# Patient Record
Sex: Female | Born: 1960 | Race: Black or African American | Hispanic: No | Marital: Single | Smoking: Never smoker
Health system: Southern US, Community
[De-identification: ages and names within clinical notes are randomized; demographics above are authoritative.]

---

## 1997-04-30 HISTORY — PX: TUBAL LIGATION: SHX77

## 2009-07-02 ENCOUNTER — Other Ambulatory Visit: Admission: RE | Admit: 2009-07-02 | Discharge: 2009-07-02 | Payer: Self-pay | Admitting: Obstetrics and Gynecology

## 2009-07-08 ENCOUNTER — Encounter: Admission: RE | Admit: 2009-07-08 | Discharge: 2009-07-08 | Payer: Self-pay | Admitting: Obstetrics and Gynecology

## 2010-08-16 ENCOUNTER — Other Ambulatory Visit: Payer: Self-pay | Admitting: Obstetrics and Gynecology

## 2010-08-16 DIAGNOSIS — Z1231 Encounter for screening mammogram for malignant neoplasm of breast: Secondary | ICD-10-CM

## 2010-08-19 ENCOUNTER — Emergency Department (HOSPITAL_COMMUNITY): Payer: PRIVATE HEALTH INSURANCE

## 2010-08-19 ENCOUNTER — Emergency Department (HOSPITAL_COMMUNITY)
Admission: EM | Admit: 2010-08-19 | Discharge: 2010-08-19 | Disposition: A | Discharge status: Home / Self Care | Payer: PRIVATE HEALTH INSURANCE | Attending: Emergency Medicine | Admitting: Emergency Medicine

## 2010-08-19 DIAGNOSIS — R059 Cough, unspecified: Secondary | ICD-10-CM | POA: Insufficient documentation

## 2010-08-19 DIAGNOSIS — R05 Cough: Secondary | ICD-10-CM | POA: Insufficient documentation

## 2010-08-19 DIAGNOSIS — I10 Essential (primary) hypertension: Secondary | ICD-10-CM | POA: Insufficient documentation

## 2010-08-19 DIAGNOSIS — E119 Type 2 diabetes mellitus without complications: Secondary | ICD-10-CM | POA: Insufficient documentation

## 2010-08-19 DIAGNOSIS — R0602 Shortness of breath: Secondary | ICD-10-CM | POA: Insufficient documentation

## 2010-08-19 LAB — BASIC METABOLIC PANEL
BUN: 13 mg/dL (ref 6–23)
CO2: 26 mEq/L (ref 19–32)
Chloride: 102 mEq/L (ref 96–112)
Creatinine, Ser: 1.1 mg/dL (ref 0.4–1.2)
GFR calc non Af Amer: 53 mL/min — ABNORMAL LOW (ref >60)
Glucose, Bld: 106 mg/dL — ABNORMAL HIGH (ref 70–99)
Potassium: 4.2 mEq/L (ref 3.5–5.1)
Sodium: 135 mEq/L (ref 135–145)

## 2010-08-19 LAB — DIFFERENTIAL
Basophils Absolute: 0 10*3/uL (ref 0.0–0.1)
Basophils Relative: 0 % (ref 0–1)
Neutro Abs: 5.9 10*3/uL (ref 1.7–7.7)
Neutrophils Relative %: 64 % (ref 43–77)

## 2010-08-19 LAB — CBC
HCT: 37.1 % (ref 36.0–46.0)
MCH: 29 pg (ref 26.0–34.0)
MCHC: 34.8 g/dL (ref 30.0–36.0)
RDW: 13.6 % (ref 11.5–15.5)

## 2010-08-19 LAB — GLUCOSE, CAPILLARY

## 2010-08-23 ENCOUNTER — Ambulatory Visit: Payer: Self-pay

## 2010-08-23 ENCOUNTER — Encounter: Payer: Self-pay | Admitting: Emergency Medicine

## 2010-08-23 ENCOUNTER — Ambulatory Visit (INDEPENDENT_AMBULATORY_CARE_PROVIDER_SITE_OTHER): Payer: PRIVATE HEALTH INSURANCE | Admitting: Emergency Medicine

## 2010-08-23 ENCOUNTER — Encounter: Payer: Self-pay | Admitting: *Deleted

## 2010-08-23 VITALS — BP 130/82 | HR 82 | Temp 98.7°F | Ht 63.5 in | Wt 188.4 lb

## 2010-08-23 DIAGNOSIS — R059 Cough, unspecified: Secondary | ICD-10-CM | POA: Insufficient documentation

## 2010-08-23 DIAGNOSIS — R05 Cough: Secondary | ICD-10-CM

## 2010-08-23 NOTE — Assessment & Plan Note (Signed)
-   stay off lisinopril - continue Nexium - restart loratadine - full PFT - voice rest - avoid throat clearing - avoid menthol cough drops - consider FOB if no resolution next time - rov 1 mo

## 2010-08-23 NOTE — Progress Notes (Signed)
Subjective:    Patient ID: Danielle Gardner, female    DOB: [birth year], 50 y.o.   MRN: [id number]  Cough This is a new problem. The current episode started more than 1 month ago. The problem has been gradually worsening. The problem occurs constantly. The cough is non-productive. Associated symptoms include chest pain, heartburn, nasal congestion, postnasal drip, rhinorrhea and a sore throat. Pertinent negatives include no chills, ear congestion, ear pain, fever, headaches, hemoptysis, myalgias, rash, shortness of breath, sweats, weight loss or wheezing. She has tried a beta-agonist inhaler for the symptoms. The treatment provided no relief. There is no history of asthma, bronchiectasis, bronchitis, COPD, emphysema, environmental allergies or pneumonia.      Review of Systems  Constitutional: Positive for fatigue. Negative for fever, chills and weight loss.  HENT: Positive for congestion, sore throat, rhinorrhea and postnasal drip. Negative for ear pain.   Respiratory: Positive for cough, choking and stridor. Negative for hemoptysis, chest tightness, shortness of breath and wheezing.   Cardiovascular: Positive for chest pain. Negative for leg swelling.  Gastrointestinal: Positive for heartburn.  Musculoskeletal: Negative for myalgias.  Skin: Negative for rash.  Neurological: Negative for headaches.  Hematological: Negative for environmental allergies.   Felt well until early March when she developed dry cough. Has a tickle in her chest and throat. Non-productive. Sometimes brings up food after eating. Used loratadine for a week, then stopped. Started on omeprazole in March, was just changed to Nexium last week. Also, of note was started on lisinopril March 20, stayed on it until April 20. She continues to have heartburn sx. More recently has had some PND, allergy sx, sore throat.   Past Medical History  Diagnosis Date  . Diabetes mellitus      Family History  Problem Relation Age of  Onset  . Prostate cancer Father   . Hypertension Father   . Hypertension Mother   . Hyperlipidemia Mother   . Diabetes Paternal Grandmother   . Diabetes Maternal Grandmother   . Colon cancer Brother   . Diabetes Brother      History   Social History  . Marital Status: Unknown    Spouse Name: N/A    Number of Children: N/A  . Years of Education: N/A   Occupational History  . administartive assistant at [employer]     Social History Main Topics  . Smoking status: Never Smoker   . Smokeless tobacco: Not on file  . Alcohol Use: No  . Drug Use: No  . Sexually Active: Not on file   Other Topics Concern  . Not on file   Social History Narrative  . No narrative on file     No Known Allergies   No outpatient prescriptions prior to visit.         Objective:   Physical Exam  Constitutional: She is oriented to person, place, and time. She appears well-developed and well-nourished. No distress.  HENT:  Head: Normocephalic and atraumatic.  Mouth/Throat: No oropharyngeal exudate.  Eyes: Conjunctivae are normal. Pupils are equal, round, and reactive to light. Right eye exhibits no discharge. Left eye exhibits no discharge. No scleral icterus.  Neck: Normal range of motion. Neck supple. No JVD present.  Cardiovascular: Normal rate and regular rhythm.  Exam reveals no gallop and no friction rub.   No murmur heard. Pulmonary/Chest: Effort normal and breath sounds normal. No respiratory distress. She has no wheezes. She has no rales. She exhibits no tenderness.  Musculoskeletal: Normal range of motion.  She exhibits no edema.  Lymphadenopathy:    She has no cervical adenopathy.  Neurological: She is alert and oriented to person, place, and time.  Skin: Skin is warm and dry. No rash noted. She is not diaphoretic.  Psychiatric: She has a normal mood and affect. Her behavior is normal. Judgment and thought content normal.          Assessment & Plan:  Cough - stay off  lisinopril - continue Nexium - restart loratadine - full PFT - voice rest - avoid throat clearing - avoid menthol cough drops - consider FOB if no resolution next time - rov 1 mo

## 2010-08-23 NOTE — Progress Notes (Signed)
  Subjective:    Patient ID: Danielle Gardner, female    DOB: 1960-09-28, 50 y.o.   MRN: [id number]  HPI    Review of Systems  HENT: Positive for congestion, sore throat, sneezing and postnasal drip.   Respiratory: Positive for cough.        Objective:   Physical Exam        Assessment & Plan:

## 2010-08-23 NOTE — Patient Instructions (Signed)
-   stay off lisinopril - continue Nexium daily - restart loratadine 10mg  daily - full Pulmonary Function Testing at next visit - voice rest this weekend if possible - avoid throat clearing - avoid menthol cough drops; use sugar-free candies instead - we will consider bronchoscopy if no resolution by next time - follow up with Dr Delton Coombes in 1 mo

## 2010-08-25 ENCOUNTER — Ambulatory Visit: Payer: Self-pay

## 2010-09-01 ENCOUNTER — Ambulatory Visit: Payer: Self-pay

## 2010-09-20 ENCOUNTER — Ambulatory Visit: Payer: PRIVATE HEALTH INSURANCE | Admitting: Emergency Medicine

## 2010-09-22 ENCOUNTER — Encounter: Payer: Self-pay | Admitting: Emergency Medicine

## 2010-09-27 ENCOUNTER — Other Ambulatory Visit: Payer: Self-pay | Admitting: Obstetrics and Gynecology

## 2010-09-27 DIAGNOSIS — Z1239 Encounter for other screening for malignant neoplasm of breast: Secondary | ICD-10-CM

## 2010-09-28 ENCOUNTER — Ambulatory Visit
Admission: RE | Admit: 2010-09-28 | Discharge: 2010-09-28 | Disposition: A | Discharge status: Home / Self Care | Payer: PRIVATE HEALTH INSURANCE | Source: Ambulatory Visit | Attending: Obstetrics and Gynecology | Admitting: Obstetrics and Gynecology

## 2010-09-28 DIAGNOSIS — Z1239 Encounter for other screening for malignant neoplasm of breast: Secondary | ICD-10-CM

## 2011-11-21 ENCOUNTER — Other Ambulatory Visit: Payer: Self-pay | Admitting: Family Medicine

## 2011-11-21 DIAGNOSIS — Z1231 Encounter for screening mammogram for malignant neoplasm of breast: Secondary | ICD-10-CM

## 2011-11-22 ENCOUNTER — Ambulatory Visit
Admission: RE | Admit: 2011-11-22 | Discharge: 2011-11-22 | Disposition: A | Discharge status: Home / Self Care | Payer: BC Managed Care – PPO | Source: Ambulatory Visit | Attending: Family Medicine | Admitting: Family Medicine

## 2011-11-22 DIAGNOSIS — Z1231 Encounter for screening mammogram for malignant neoplasm of breast: Secondary | ICD-10-CM

## 2012-04-25 ENCOUNTER — Other Ambulatory Visit (HOSPITAL_COMMUNITY)
Admission: RE | Admit: 2012-04-25 | Discharge: 2012-04-25 | Disposition: A | Discharge status: Home / Self Care | Payer: BC Managed Care – PPO | Source: Ambulatory Visit | Attending: Obstetrics and Gynecology | Admitting: Obstetrics and Gynecology

## 2012-04-25 ENCOUNTER — Other Ambulatory Visit: Payer: Self-pay | Admitting: Obstetrics and Gynecology

## 2012-04-25 DIAGNOSIS — Z01419 Encounter for gynecological examination (general) (routine) without abnormal findings: Secondary | ICD-10-CM | POA: Insufficient documentation

## 2012-04-25 DIAGNOSIS — N760 Acute vaginitis: Secondary | ICD-10-CM | POA: Insufficient documentation

## 2012-12-16 ENCOUNTER — Encounter (HOSPITAL_COMMUNITY): Payer: Self-pay | Admitting: Emergency Medicine

## 2012-12-16 ENCOUNTER — Emergency Department (HOSPITAL_COMMUNITY)
Admission: EM | Admit: 2012-12-16 | Discharge: 2012-12-16 | Disposition: A | Discharge status: Home / Self Care | Payer: PRIVATE HEALTH INSURANCE | Attending: Emergency Medicine | Admitting: Emergency Medicine

## 2012-12-16 ENCOUNTER — Emergency Department (HOSPITAL_COMMUNITY): Payer: PRIVATE HEALTH INSURANCE

## 2012-12-16 DIAGNOSIS — E119 Type 2 diabetes mellitus without complications: Secondary | ICD-10-CM | POA: Insufficient documentation

## 2012-12-16 DIAGNOSIS — R0789 Other chest pain: Secondary | ICD-10-CM | POA: Insufficient documentation

## 2012-12-16 DIAGNOSIS — M79609 Pain in unspecified limb: Secondary | ICD-10-CM | POA: Insufficient documentation

## 2012-12-16 DIAGNOSIS — R110 Nausea: Secondary | ICD-10-CM | POA: Insufficient documentation

## 2012-12-16 DIAGNOSIS — Z79899 Other long term (current) drug therapy: Secondary | ICD-10-CM | POA: Insufficient documentation

## 2012-12-16 DIAGNOSIS — M79602 Pain in left arm: Secondary | ICD-10-CM

## 2012-12-16 LAB — CBC WITH DIFFERENTIAL/PLATELET
Eosinophils Absolute: 0.1 10*3/uL (ref 0.0–0.7)
HCT: 35.7 % — ABNORMAL LOW (ref 36.0–46.0)
Lymphs Abs: 2.2 10*3/uL (ref 0.7–4.0)
MCV: 81.3 fL (ref 78.0–100.0)
Monocytes Absolute: 0.6 10*3/uL (ref 0.1–1.0)
Monocytes Relative: 12 % (ref 3–12)
Neutro Abs: 2.3 10*3/uL (ref 1.7–7.7)
Neutrophils Relative %: 45 % (ref 43–77)

## 2012-12-16 LAB — POCT I-STAT, CHEM 8: HCT: 37 % (ref 36.0–46.0)

## 2012-12-16 LAB — TROPONIN I: Troponin I: <0.3 ng/mL (ref <0.30)

## 2012-12-16 NOTE — ED Notes (Signed)
EKG given to EDP, Glick,MD.

## 2012-12-16 NOTE — ED Notes (Signed)
Pt arrived to the ED with a complaint of chest pain with radiation down her left arm.  Pt has been experiencing symptoms for three weeks.  Pain is located underneath the pt breast.  Tonight it woke her up from sleep and persisted for a period of time convincing her to come to the hospital.

## 2012-12-16 NOTE — ED Provider Notes (Signed)
Date: 12/16/2012  Rate: 56  Rhythm: sinus bradycardia  QRS Axis: normal  Intervals: PR prolonged  ST/T Wave abnormalities: normal  Conduction Disutrbances:first-degree A-V block   Narrative Interpretation: Sinus bradycardia, first degree AV block. No prior ECG available for comparison. Old EKG Reviewed: none available  Medical screening examination/treatment/procedure(s) were performed by non-physician practitioner and as supervising physician I was immediately available for consultation/collaboration.   Dione Booze, MD 12/16/12 [phone]

## 2012-12-16 NOTE — ED Provider Notes (Signed)
CSN: [id number]     Arrival date & time 12/16/12  0124 History     First MD Initiated Contact with Patient 12/16/12 [phone]     Chief Complaint  Patient presents with  . Arm Pain   (Consider location/radiation/quality/duration/timing/severity/associated sxs/prior Treatment) HPI Comments: This 52 year old female, who comes in reporting, that she the past 3, weeks.  She's had intermittent, left arm.  Pain.  That is sharp to dull in nature.  She she occasionally gets pain under her left breast associated with the arm.  Pain.  She's been told in the past, that she is "prediabetic" she does have a strong family history of diabetes.  Her last physical was last January reported, "normal" she has not contacted her primary care physician for this discomfort.  She has not taken any medication for this discomfort.  She cannot relate it to a particular activity, food intake  Patient is a 52 y.o. female presenting with arm pain. The history is provided by the patient.  Arm Pain This is a new problem. The current episode started 1 to 4 weeks ago. The problem occurs intermittently. The problem has been gradually worsening. Associated symptoms include chest pain and nausea. Pertinent negatives include no chills, congestion, coughing, fever, rash or sore throat. Nothing aggravates the symptoms. She has tried nothing for the symptoms.    Past Medical History  Diagnosis Date  . Diabetes mellitus    Past Surgical History  Procedure Laterality Date  . Tubal ligation  04-30-1997  . Cesarean section      x 3   Family History  Problem Relation Age of Onset  . Prostate cancer Father   . Hypertension Father   . Hypertension Mother   . Hyperlipidemia Mother   . Diabetes Paternal Grandmother   . Diabetes Maternal Grandmother   . Colon cancer Brother   . Diabetes Brother    History  Substance Use Topics  . Smoking status: Never Smoker   . Smokeless tobacco: Not on file  . Alcohol Use: No   OB History   Grav Para Term Preterm Abortions TAB SAB Ect Mult Living                 Review of Systems  Constitutional: Negative for fever and chills.  HENT: Negative for congestion and sore throat.   Respiratory: Negative for cough and shortness of breath.   Cardiovascular: Positive for chest pain. Negative for palpitations and leg swelling.  Gastrointestinal: Positive for nausea.  Musculoskeletal: Negative for back pain.  Skin: Negative for rash and wound.  Neurological: Negative for dizziness.  All other systems reviewed and are negative.    Allergies  Review of patient's allergies indicates no known allergies.  Home Medications   Current Outpatient Rx  Name  Route  Sig  Dispense  Refill  . omega-3 acid ethyl esters (LOVAZA) 1 G capsule   Oral   Take 1 g by mouth every evening.          BP 155/94  Pulse 55  Temp(Src) 98.4 F (36.9 C) (Oral)  Resp 16  SpO2 100%  LMP 09/15/2012 Physical Exam  Nursing note and vitals reviewed. Constitutional: She is oriented to person, place, and time. She appears well-developed and well-nourished.  HENT:  Head: Normocephalic.  Eyes: Pupils are equal, round, and reactive to light.  Neck: Normal range of motion.  Cardiovascular: Normal rate and regular rhythm.   Pulmonary/Chest: Effort normal and breath sounds normal. No respiratory distress. She has  no wheezes. She exhibits no tenderness.  Abdominal: Soft.  Musculoskeletal: Normal range of motion. She exhibits no edema and no tenderness.  Neurological: She is alert and oriented to person, place, and time.  Skin: Skin is warm. No rash noted. No erythema.    ED Course   Procedures (including critical care time)  Labs Reviewed  CBC WITH DIFFERENTIAL - Abnormal; Notable for the following:    HCT 35.7 (*)    Platelets 109 (*)    All other components within normal limits  POCT I-STAT, CHEM 8 - Abnormal; Notable for the following:    Creatinine, Ser 1.20 (*)    Glucose, Bld 133 (*)     All other components within normal limits  TROPONIN I   Dg Chest 2 View  12/16/2012   *RADIOLOGY REPORT*  Clinical Data: Chest pain radiating to the left arm on off for 3 weeks.  CHEST - 2 VIEW  Comparison: 08/19/2010  Findings: shallow inspiration. The heart size and pulmonary vascularity are normal. The lungs appear clear and expanded without focal air space disease or consolidation. No blunting of the costophrenic angles.  No pneumothorax.  Mediastinal contours appear intact.  Degenerative changes in the thoracic spine.  No significant change since previous study.  IMPRESSION: Shallow inspiration.  No evidence of active pulmonary disease.   Original Report Authenticated By: Burman Nieves, M.D.   1. Arm pain, diffuse, left   2. Chest pain, atypical    ED ECG REPORT   Date: 12/16/2012  EKG Time: 3:34 AM  Rate: 55  Rhythm: sinus bradycardia,  there are no previous tracings available for comparison  Axis: normal  Intervals:first-degree A-V block   ST&T Change: normal  Narrative Interpretation: abnorml           MDM  Discussed patient's lab results EKG, and chest x-ray with patient reassured.  Her and encourage her to followup with her primary care physician for further evaluation of her left arm pain    Arman Filter, NP 12/16/12 [phone]

## 2013-03-06 ENCOUNTER — Other Ambulatory Visit: Payer: Self-pay

## 2013-05-05 ENCOUNTER — Other Ambulatory Visit: Payer: Self-pay

## 2013-05-05 DIAGNOSIS — Z1231 Encounter for screening mammogram for malignant neoplasm of breast: Secondary | ICD-10-CM

## 2013-07-22 ENCOUNTER — Other Ambulatory Visit: Payer: Self-pay | Admitting: Family Medicine

## 2013-07-22 DIAGNOSIS — N189 Chronic kidney disease, unspecified: Secondary | ICD-10-CM

## 2013-07-23 ENCOUNTER — Ambulatory Visit
Admission: RE | Admit: 2013-07-23 | Discharge: 2013-07-23 | Disposition: A | Discharge status: Home / Self Care | Payer: BC Managed Care – PPO | Source: Ambulatory Visit | Attending: Family Medicine | Admitting: Family Medicine

## 2013-07-23 DIAGNOSIS — N189 Chronic kidney disease, unspecified: Secondary | ICD-10-CM

## 2013-07-24 ENCOUNTER — Other Ambulatory Visit: Payer: BC Managed Care – PPO

## 2013-10-01 ENCOUNTER — Ambulatory Visit
Admission: RE | Admit: 2013-10-01 | Discharge: 2013-10-01 | Disposition: A | Discharge status: Home / Self Care | Payer: BC Managed Care – PPO | Source: Ambulatory Visit

## 2013-10-01 ENCOUNTER — Other Ambulatory Visit: Payer: Self-pay | Admitting: Obstetrics and Gynecology

## 2013-10-01 ENCOUNTER — Other Ambulatory Visit (HOSPITAL_COMMUNITY)
Admission: RE | Admit: 2013-10-01 | Discharge: 2013-10-01 | Disposition: A | Discharge status: Home / Self Care | Payer: BC Managed Care – PPO | Source: Ambulatory Visit | Attending: Obstetrics and Gynecology | Admitting: Obstetrics and Gynecology

## 2013-10-01 DIAGNOSIS — Z1231 Encounter for screening mammogram for malignant neoplasm of breast: Secondary | ICD-10-CM

## 2013-10-01 DIAGNOSIS — Z1151 Encounter for screening for human papillomavirus (HPV): Secondary | ICD-10-CM | POA: Insufficient documentation

## 2013-10-01 DIAGNOSIS — Z01419 Encounter for gynecological examination (general) (routine) without abnormal findings: Secondary | ICD-10-CM | POA: Insufficient documentation

## 2013-10-08 LAB — CYTOLOGY - PAP

## 2015-04-05 ENCOUNTER — Other Ambulatory Visit: Payer: Self-pay

## 2015-04-05 DIAGNOSIS — Z0000 Encounter for general adult medical examination without abnormal findings: Secondary | ICD-10-CM

## 2015-05-04 ENCOUNTER — Other Ambulatory Visit: Payer: Self-pay | Admitting: Obstetrics and Gynecology

## 2015-05-04 ENCOUNTER — Other Ambulatory Visit (HOSPITAL_COMMUNITY)
Admission: RE | Admit: 2015-05-04 | Discharge: 2015-05-04 | Disposition: A | Discharge status: Home / Self Care | Payer: BLUE CROSS/BLUE SHIELD | Source: Ambulatory Visit | Attending: Obstetrics and Gynecology | Admitting: Obstetrics and Gynecology

## 2015-05-04 DIAGNOSIS — Z01419 Encounter for gynecological examination (general) (routine) without abnormal findings: Secondary | ICD-10-CM | POA: Diagnosis present

## 2015-05-07 ENCOUNTER — Ambulatory Visit
Admission: RE | Admit: 2015-05-07 | Discharge: 2015-05-07 | Disposition: A | Discharge status: Home / Self Care | Payer: BLUE CROSS/BLUE SHIELD | Source: Ambulatory Visit

## 2015-05-07 DIAGNOSIS — Z0000 Encounter for general adult medical examination without abnormal findings: Secondary | ICD-10-CM

## 2015-05-07 LAB — CYTOLOGY - PAP

## 2016-03-09 ENCOUNTER — Other Ambulatory Visit: Payer: Self-pay | Admitting: Family Medicine

## 2016-03-09 DIAGNOSIS — R634 Abnormal weight loss: Secondary | ICD-10-CM

## 2016-03-14 ENCOUNTER — Ambulatory Visit
Admission: RE | Admit: 2016-03-14 | Discharge: 2016-03-14 | Disposition: A | Discharge status: Home / Self Care | Payer: PRIVATE HEALTH INSURANCE | Source: Ambulatory Visit | Attending: Family Medicine | Admitting: Family Medicine

## 2016-03-14 DIAGNOSIS — R634 Abnormal weight loss: Secondary | ICD-10-CM

## 2016-07-07 ENCOUNTER — Other Ambulatory Visit: Payer: Self-pay | Admitting: Family Medicine

## 2016-07-07 DIAGNOSIS — R634 Abnormal weight loss: Secondary | ICD-10-CM

## 2016-07-11 ENCOUNTER — Other Ambulatory Visit: Payer: PRIVATE HEALTH INSURANCE

## 2016-07-24 ENCOUNTER — Other Ambulatory Visit: Payer: PRIVATE HEALTH INSURANCE

## 2016-07-24 ENCOUNTER — Ambulatory Visit
Admission: RE | Admit: 2016-07-24 | Discharge: 2016-07-24 | Disposition: A | Discharge status: Home / Self Care | Payer: PRIVATE HEALTH INSURANCE | Source: Ambulatory Visit | Attending: Family Medicine | Admitting: Family Medicine

## 2016-07-24 DIAGNOSIS — R634 Abnormal weight loss: Secondary | ICD-10-CM

## 2016-07-24 MED ORDER — IOPAMIDOL (ISOVUE-300) INJECTION 61%
100.0000 mL | Freq: Once | INTRAVENOUS | Status: AC | PRN
  Administered 2016-07-24: 100 mL via INTRAVENOUS

## 2016-10-12 ENCOUNTER — Ambulatory Visit: Payer: PRIVATE HEALTH INSURANCE | Admitting: Registered"

## 2016-10-25 ENCOUNTER — Encounter: Payer: Self-pay | Admitting: Registered"

## 2016-10-25 ENCOUNTER — Encounter: Payer: PRIVATE HEALTH INSURANCE | Attending: Family Medicine | Admitting: Registered"

## 2016-10-25 DIAGNOSIS — E119 Type 2 diabetes mellitus without complications: Secondary | ICD-10-CM | POA: Insufficient documentation

## 2016-10-25 DIAGNOSIS — Z713 Dietary counseling and surveillance: Secondary | ICD-10-CM | POA: Diagnosis not present

## 2016-10-25 DIAGNOSIS — E118 Type 2 diabetes mellitus with unspecified complications: Secondary | ICD-10-CM

## 2016-10-25 NOTE — Progress Notes (Signed)
Diabetes Self-Management Education  Visit Type: First/Initial  Appt. Start Time: 0900 Appt. End Time: 1000  10/25/2016  Danielle Gardner, identified by name and date of birth, is a 56 y.o. female with a diagnosis of Diabetes: Type 2.   ASSESSMENT Primary concerns today: Pt states her A1c was off the charts and she states her body was doing something "funky" and experienced unexplained ~20 lb weight loss Oct-Feb. Pt reports her weight started stabilizing end of March has stayed in the 160-165 lb. Pt thinks she she may have heart issues and will be following up with cardiologist.  Pt states she has a very busy life and is constantly on the go. Pt states she wakes up at 5:30, will exercise (1-3x/week), shower, breakfast.   Busy with children and son's activities, on the go. Pt states this diabetes education is a review for her because she dealt with her son's juvenile DM which he passed away in 6789 from F8BO complications.  Sleep quality varies and menopause symptoms can disturb sleep.      Diabetes Self-Management Education - 10/25/16 0901      Visit Information   Visit Type First/Initial     Initial Visit   Diabetes Type Type 2   Are you currently following a meal plan? No   Are you taking your medications as prescribed? No  avoids all medications, wants to use natural means to support health   Date Diagnosed 2011     Health Coping   How would you rate your overall health? Good     Psychosocial Assessment   Patient Belief/Attitude about Diabetes Motivated to manage diabetes   What is the last grade level you completed in school? some college     Complications   Last HgB A1C per patient/outside source 14 %  over 14 per referral documents   How often do you check your blood sugar? 1-2 times/day   Fasting Blood glucose range (mg/dL) 130-179  fasting and after exercising 135   Postprandial Blood glucose range (mg/dL) 130-179  170-180   Number of hypoglycemic episodes per  month 0   Number of hyperglycemic episodes per week 0   Have you had a dilated eye exam in the past 12 months? Yes   Have you had a dental exam in the past 12 months? No   Are you checking your feet? Yes   How many days per week are you checking your feet? 7     Dietary Intake   Breakfast spinach, fennel, 2 eggs   Snack (morning) none   Lunch oftne none "turned off from food" OR meal replacement shake or nuts-pistachios   Snack (afternoon) apple OR chips or crackers to take edge off   Dinner cereal OR salmon OR [place] sandwich OR 2 boiled eggs   Snack (evening) --  if eats after 6 she regurgitates     Exercise   Exercise Type Moderate (swimming / aerobic walking)   How many days per week to you exercise? 2   How many minutes per day do you exercise? 45   Total minutes per week of exercise 90     Patient Education   Previous Diabetes Education No   Disease state  Definition of diabetes, type 1 and 2, and the diagnosis of diabetes;Factors that contribute to the development of diabetes   Nutrition management  Role of diet in the treatment of diabetes and the relationship between the three main macronutrients and blood glucose level  Physical activity and exercise  Role of exercise on diabetes management, blood pressure control and cardiac health.   Monitoring Identified appropriate SMBG and/or A1C goals.;Purpose and frequency of SMBG.   Acute complications Taught treatment of hypoglycemia - the 15 rule.   Chronic complications Relationship between chronic complications and blood glucose control   Psychosocial adjustment Role of stress on diabetes     Individualized Goals (developed by patient)   Nutrition General guidelines for healthy choices and portions discussed   Physical Activity Exercise 3-5 times per week     Outcomes   Expected Outcomes Demonstrated interest in learning. Expect positive outcomes   Future DMSE PRN   Program Status Completed    Individualized Plan for  Diabetes Self-Management Training:   Learning Objective:  Patient will have a greater understanding of diabetes self-management. Patient education plan is to attend individual and/or group sessions per assessed needs and concerns.  Patient Instructions   Aim to get restful sleep, can try getting back into your tea routine before bed, and when waking during the night can try focusing on breath to slow the mind.  Continue with your plan for self-care and include creating ways to help manage stress.  Consider eating balanced meals and snacks and include proteins when eating carbs.  Consider check fasting BG before and after exercising.  Expected Outcomes:  Demonstrated interest in learning. Expect positive outcomes  Education material provided: Living Well with Diabetes and A1C conversion sheet, BG monitoring log book  If problems or questions, patient to contact team via:  Phone and MyChart  Future DSME appointment: PRN

## 2016-10-25 NOTE — Patient Instructions (Signed)
   Aim to get restful sleep, can try getting back into your tea routine before bed, and when waking during the night can try focusing on breath to slow the mind.  Continue with your plan for self-care and include creating ways to help manage stress.  Consider eating balanced meals and snacks and include proteins when eating carbs.  Consider check fasting BG before and after exercising.

## 2017-06-20 ENCOUNTER — Other Ambulatory Visit: Payer: Self-pay | Admitting: Family Medicine

## 2017-06-20 DIAGNOSIS — Z1231 Encounter for screening mammogram for malignant neoplasm of breast: Secondary | ICD-10-CM

## 2017-06-26 ENCOUNTER — Ambulatory Visit
Admission: RE | Admit: 2017-06-26 | Discharge: 2017-06-26 | Disposition: A | Discharge status: Home / Self Care | Payer: PRIVATE HEALTH INSURANCE | Source: Ambulatory Visit | Attending: Family Medicine | Admitting: Family Medicine

## 2017-06-26 DIAGNOSIS — Z1231 Encounter for screening mammogram for malignant neoplasm of breast: Secondary | ICD-10-CM

## 2017-07-25 ENCOUNTER — Other Ambulatory Visit (HOSPITAL_COMMUNITY)
Admission: RE | Admit: 2017-07-25 | Discharge: 2017-07-25 | Disposition: A | Discharge status: Home / Self Care | Payer: PRIVATE HEALTH INSURANCE | Source: Ambulatory Visit | Attending: Obstetrics and Gynecology | Admitting: Obstetrics and Gynecology

## 2017-07-25 ENCOUNTER — Other Ambulatory Visit: Payer: Self-pay | Admitting: Obstetrics and Gynecology

## 2017-07-25 DIAGNOSIS — Z124 Encounter for screening for malignant neoplasm of cervix: Secondary | ICD-10-CM | POA: Diagnosis not present

## 2017-07-26 LAB — CYTOLOGY - PAP
Adequacy: ABSENT
Diagnosis: NEGATIVE
HPV (WINDOPATH): NOT DETECTED

## 2017-10-25 DIAGNOSIS — Q263 Partial anomalous pulmonary venous connection: Secondary | ICD-10-CM | POA: Insufficient documentation

## 2017-10-25 NOTE — Progress Notes (Signed)
Cardiology Office Note    Date:  10/26/2017   ID:  Danielle Gardner, DOB 1960-06-23, MRN [id number]  PCP:  Mayra Neer, MD  Cardiologist: Sinclair Grooms, MD   Chief Complaint  Patient presents with  . Coronary Artery Disease  . Follow-up    Abnormal CT scan    History of Present Illness:  Danielle Gardner is a 57 y.o. female referred for abnormal CT chest "No definite coronary artery calcifications. 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 Left upper lobe pulmonary veins are draining into the left brachiocephalic vein."  Dyspnea on exertion and occasional PND.  States this is been going on for years.  The patient requested cardiology consult.  Several months ago she was having unexplained weight loss.  This led to CT scan to look for explanations.  CT demonstrated partial anomalous pulmonary venous return from the left upper lobe to the left brachiocephalic vein.  She hopes to be proactive not to have heart problems and wanted further explanation.  She denies chest discomfort, exertional fatigue, dyspnea, edema, and syncope.  She has occasional palpitations.  These episodes do not last longer than seconds.  In reviewing other clinical data she has a terrible metabolic state with uncontrolled diabetes, severe hyperlipidemia, possible calcification in coronary arteries, and uncontrolled blood pressure.  She is apparently resistant therapy.  Past Medical History:  Diagnosis Date  . Diabetes mellitus     Past Surgical History:  Procedure Laterality Date  . CESAREAN SECTION     x 3  . TUBAL LIGATION  04-30-1997    Current Medications: Outpatient Medications Prior to Visit  Medication Sig Dispense Refill  . aspirin 81 MG tablet Take 81 mg by mouth daily.    . Cholecalciferol (VITAMIN D) 2000 units tablet Take 1 tablet by mouth daily.    . insulin degludec (TRESIBA) 100 UNIT/ML SOPN FlexTouch Pen Inject 25 Units  into the skin daily as needed. (Pending blood glucose)    . omega-3 acid ethyl esters (LOVAZA) 1 G capsule Take 1 g by mouth every evening.     No facility-administered medications prior to visit.      Allergies:   Patient has no known allergies.   Social History   Socioeconomic History  . Marital status: Single    Spouse name: Not on file  . Number of children: Not on file  . Years of education: Not on file  . Highest education level: Not on file  Occupational History  . Occupation: Advertising account executive at [employer]  . Financial resource strain: Not on file  . Food insecurity:    Worry: Not on file    Inability: Not on file  . Transportation needs:    Medical: Not on file    Non-medical: Not on file  Tobacco Use  . Smoking status: Never Smoker  . Smokeless tobacco: Never Used  Substance and Sexual Activity  . Alcohol use: No  . Drug use: No  . Sexual activity: Not on file  Lifestyle  . Physical activity:    Days per week: Not on file    Minutes per session: Not on file  . Stress: Not on file  Relationships  . Social connections:    Talks on phone: Not on file    Gets together: Not on file    Attends religious service: Not on file    Active member of club or organization: Not  on file    Attends meetings of clubs or organizations: Not on file    Relationship status: Not on file  Other Topics Concern  . Not on file  Social History Narrative  . Not on file     Family History:  The patient's family history includes Colon cancer in her brother; Diabetes in her brother, brother, father, maternal grandmother, paternal grandmother, and son; Heart disease in her father; Hyperlipidemia in her mother; Hypertension in her father and mother; Lupus in her sister; Prostate cancer in her father; Vision loss in her father.   ROS:   Please see the history of present illness.    Splane weight change. All other systems reviewed and are negative.   PHYSICAL EXAM:    VS:  BP (!) 160/96   Pulse 60   Ht 5\' 4"  (1.626 m)   Wt 169 lb (76.7 kg)   LMP 09/15/2012   BMI 29.01 kg/m    GEN: Well nourished, well developed, in no acute distress  HEENT: normal  Neck: no JVD, carotid bruits, or masses Cardiac: RRR; no murmurs, rubs, or gallops,no edema  Respiratory:  clear to auscultation bilaterally, normal work of breathing GI: soft, nontender, nondistended, + BS MS: no deformity or atrophy  Skin: warm and dry, no rash Neuro:  Alert and Oriented x 3, Strength and sensation are intact Psych: euthymic mood, full affect  Wt Readings from Last 3 Encounters:  10/26/17 169 lb (76.7 kg)  08/23/10 188 lb 6.4 oz (85.5 kg)      Studies/Labs Reviewed:   EKG:  EKG low-voltage with otherwise normal appearance  Recent Labs: No results found for requested labs within last 8760 hours.   Lipid Panel No results found for: CHOL, TRIG, HDL, CHOLHDL, VLDL, LDLCALC, LDLDIRECT  Additional studies/ records that were reviewed today include:  No prior cardiac data is available.    ASSESSMENT:    1. Partial anomalous pulmonary venous return   2. Controlled type 2 diabetes mellitus with other circulatory complication, without long-term current use of insulin ([place])   3. Essential hypertension   4. Familial hypercholesterolemia   5. Family history of early CAD      PLAN:  In order of problems listed above:  1. Can be associated with ASD.  We will do a 2D Doppler echocardiogram with bubble study.  Any evidence of right heart volume overload or shunting may require further evaluation.  If no ASD and normal right ventricular function, partial anomalous pulmonary venous return will require no subsequent management. 2. Needs hemoglobin A1c less than 7.  I spoke to her concerning the utility of Tyler Aas and the importance of taking the medication daily and working vigorously to get A1c under control. 3. Needs therapy for hypertension.  Target 130/80 mmHg.  Backbone of  therapy should be with an ACE or arm to protect kidney function.  Long discussion with her concerning this. 4. Needs to be on statin therapy to push LDL less than or equal to 70.  This will go a long way in preventing future cardiac events.  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  2D Doppler echocardiogram will be done to exclude right heart volume overload and ASD.  More importantly, her metabolic status is terrible and she is high risk for vascular events going forward.  I discussed the importance of aerobic activity including greater than 150 minutes/week of moderate physical activity, change in diet to include a more plant-based approach, weight  loss, and medication therapy to control risk factors as noted above.  She seems reluctant to accept the idea that risk modification with medication will be helpful.    Medication Adjustments/Labs and Tests Ordered: Current medicines are reviewed at length with the patient today.  Concerns regarding medicines are outlined above.  Medication changes, Labs and Tests ordered today are listed in the Patient Instructions below. Patient Instructions  Medication Instructions:  Your physician recommends that you continue on your current medications as directed. Please refer to the Current Medication list given to you today.  Labwork: None  Testing/Procedures: Your physician has requested that you have an echocardiogram bubble study. Echocardiography is a painless test that uses sound waves to create images of your heart. It provides your doctor with information about the size and shape of your heart and how well your heart's chambers and valves are working. This procedure takes approximately one hour. There are no restrictions for this procedure.    Follow-Up: Your physician recommends that you schedule a follow-up appointment as needed with Dr. Tamala Julian.    Any Other Special Instructions Will Be Listed Below (If  Applicable).     If you need a refill on your cardiac medications before your next appointment, please call your pharmacy.      Signed, Sinclair Grooms, MD  10/26/2017 12:51 PM    [facility] [address], [place], [place]  [address] Phone: [phone]; Fax: [phone]

## 2017-10-26 ENCOUNTER — Encounter: Payer: Self-pay | Admitting: Interventional Cardiology

## 2017-10-26 ENCOUNTER — Ambulatory Visit (INDEPENDENT_AMBULATORY_CARE_PROVIDER_SITE_OTHER): Payer: PRIVATE HEALTH INSURANCE | Admitting: Interventional Cardiology

## 2017-10-26 VITALS — BP 160/96 | HR 60 | Ht 64.0 in | Wt 169.0 lb

## 2017-10-26 DIAGNOSIS — I10 Essential (primary) hypertension: Secondary | ICD-10-CM

## 2017-10-26 DIAGNOSIS — E7801 Familial hypercholesterolemia: Secondary | ICD-10-CM

## 2017-10-26 DIAGNOSIS — E1159 Type 2 diabetes mellitus with other circulatory complications: Secondary | ICD-10-CM

## 2017-10-26 DIAGNOSIS — Q263 Partial anomalous pulmonary venous connection: Secondary | ICD-10-CM | POA: Diagnosis not present

## 2017-10-26 DIAGNOSIS — Z8249 Family history of ischemic heart disease and other diseases of the circulatory system: Secondary | ICD-10-CM

## 2017-10-26 NOTE — Patient Instructions (Signed)
Medication Instructions:  Your physician recommends that you continue on your current medications as directed. Please refer to the Current Medication list given to you today.  Labwork: None  Testing/Procedures: Your physician has requested that you have an echocardiogram bubble study. Echocardiography is a painless test that uses sound waves to create images of your heart. It provides your doctor with information about the size and shape of your heart and how well your heart's chambers and valves are working. This procedure takes approximately one hour. There are no restrictions for this procedure.    Follow-Up: Your physician recommends that you schedule a follow-up appointment as needed with Dr. Tamala Julian.    Any Other Special Instructions Will Be Listed Below (If Applicable).     If you need a refill on your cardiac medications before your next appointment, please call your pharmacy.

## 2017-11-05 ENCOUNTER — Other Ambulatory Visit: Payer: Self-pay

## 2017-11-05 ENCOUNTER — Ambulatory Visit (HOSPITAL_COMMUNITY): Payer: PRIVATE HEALTH INSURANCE | Attending: Cardiology

## 2017-11-05 DIAGNOSIS — E119 Type 2 diabetes mellitus without complications: Secondary | ICD-10-CM | POA: Insufficient documentation

## 2017-11-05 DIAGNOSIS — Q263 Partial anomalous pulmonary venous connection: Secondary | ICD-10-CM | POA: Insufficient documentation

## 2017-11-05 DIAGNOSIS — I358 Other nonrheumatic aortic valve disorders: Secondary | ICD-10-CM | POA: Diagnosis not present

## 2017-11-05 DIAGNOSIS — I10 Essential (primary) hypertension: Secondary | ICD-10-CM | POA: Diagnosis not present

## 2017-11-05 DIAGNOSIS — E785 Hyperlipidemia, unspecified: Secondary | ICD-10-CM | POA: Diagnosis not present

## 2017-11-08 ENCOUNTER — Telehealth: Payer: Self-pay | Admitting: Interventional Cardiology

## 2017-11-08 NOTE — Telephone Encounter (Signed)
New Message   Pt returning call for [facility]. Regarding results

## 2017-11-08 NOTE — Telephone Encounter (Signed)
Informed pt of results. Pt verbalized understanding. 

## 2017-11-16 ENCOUNTER — Telehealth: Payer: Self-pay | Admitting: Interventional Cardiology

## 2017-11-16 DIAGNOSIS — C9200 Acute myeloblastic leukemia, not having achieved remission: Secondary | ICD-10-CM

## 2017-11-16 NOTE — Telephone Encounter (Signed)
Pt calling and stating she is returning a call to nurse. Please call pt.

## 2017-11-16 NOTE — Telephone Encounter (Signed)
Pt is insisting on having CTA on what was stated in echo impressions section.Will forward to Dr Tamala Julian for review .Adonis Housekeeper

## 2017-11-19 NOTE — Telephone Encounter (Signed)
PT AWARE AND ORDER ENTERED .Danielle Gardner

## 2017-11-19 NOTE — Telephone Encounter (Signed)
Okay to order cardiac CT Angiogram " partial anomalous pulmonary venous return".  Will not likely change our approach.

## 2018-03-08 ENCOUNTER — Ambulatory Visit (HOSPITAL_COMMUNITY)
Admission: RE | Admit: 2018-03-08 | Discharge: 2018-03-08 | Disposition: A | Discharge status: Home / Self Care | Payer: PRIVATE HEALTH INSURANCE | Source: Ambulatory Visit | Attending: Interventional Cardiology | Admitting: Interventional Cardiology

## 2018-03-08 DIAGNOSIS — I2510 Atherosclerotic heart disease of native coronary artery without angina pectoris: Secondary | ICD-10-CM

## 2018-03-08 DIAGNOSIS — Q263 Partial anomalous pulmonary venous connection: Secondary | ICD-10-CM | POA: Diagnosis not present

## 2018-03-08 DIAGNOSIS — I700 Atherosclerosis of aorta: Secondary | ICD-10-CM | POA: Insufficient documentation

## 2018-03-08 DIAGNOSIS — R918 Other nonspecific abnormal finding of lung field: Secondary | ICD-10-CM

## 2018-03-08 DIAGNOSIS — C9200 Acute myeloblastic leukemia, not having achieved remission: Secondary | ICD-10-CM | POA: Insufficient documentation

## 2018-03-08 MED ORDER — NITROGLYCERIN 0.4 MG SL SUBL
SUBLINGUAL_TABLET | SUBLINGUAL | Status: AC
  Administered 2018-03-08: 0.8 mg via SUBLINGUAL
  Filled 2018-03-08: qty 2

## 2018-03-08 MED ORDER — IOPAMIDOL (ISOVUE-370) INJECTION 76%
100.0000 mL | Freq: Once | INTRAVENOUS | Status: AC | PRN
  Administered 2018-03-08: 100 mL via INTRAVENOUS

## 2018-03-08 MED ORDER — NITROGLYCERIN 0.4 MG SL SUBL
0.8000 mg | SUBLINGUAL_TABLET | SUBLINGUAL | Status: DC | PRN
  Administered 2018-03-08: 0.8 mg via SUBLINGUAL
  Filled 2018-03-08: qty 25

## 2018-03-08 NOTE — Progress Notes (Signed)
Pt had 80ccs isovue 370 extravasation through 20g diffusics IV in the right AC. Larene Beach, IR PA, assessed pt, IV was removed, arm elevated and ice placed at site. Verbal instructions were given to pt and RN, Brandy. Extravasation orders were placed.

## 2018-03-08 NOTE — Progress Notes (Signed)
Called to evaluate right upper arm for contrast extravasation - 80 cc Isovue contrast extravasation noted prior to exam completion.  Patient is not complaining of any pain - she does report tightness to her right upper arm.  There is firmness just above the IV site - IV has been removed prior to exam.  There is no tenderness, erythema, hematoma or skin changes.  Full ROM in fingers, wrist, elbow. Pulses intact.  Recommend elevate right arm for 6-12 hours. Ice pack 20-60 minutes 4 times per day and as needed for pain.  Patient understands to call radiology/return to ED if condition worsens, there is any change in skin at affected area, any neurologic symptoms arise or decreased ROM in the hand or arm.  Candiss Norse, PA-C Interventional Radiology 03/08/18 [phone]

## 2018-03-11 ENCOUNTER — Ambulatory Visit (HOSPITAL_COMMUNITY)
Admission: RE | Admit: 2018-03-11 | Discharge: 2018-03-11 | Disposition: A | Discharge status: Home / Self Care | Payer: No Typology Code available for payment source | Source: Ambulatory Visit | Attending: Interventional Cardiology | Admitting: Interventional Cardiology

## 2018-03-11 DIAGNOSIS — R9439 Abnormal result of other cardiovascular function study: Secondary | ICD-10-CM

## 2018-03-11 DIAGNOSIS — Q263 Partial anomalous pulmonary venous connection: Secondary | ICD-10-CM | POA: Insufficient documentation

## 2018-03-11 DIAGNOSIS — C9200 Acute myeloblastic leukemia, not having achieved remission: Secondary | ICD-10-CM | POA: Insufficient documentation

## 2018-03-11 DIAGNOSIS — I2510 Atherosclerotic heart disease of native coronary artery without angina pectoris: Secondary | ICD-10-CM | POA: Insufficient documentation

## 2018-03-12 DIAGNOSIS — I2510 Atherosclerotic heart disease of native coronary artery without angina pectoris: Secondary | ICD-10-CM | POA: Insufficient documentation

## 2018-03-12 NOTE — Progress Notes (Signed)
Cardiology Office Note:    Date:  03/13/2018   ID:  Danielle Gardner, DOB 1961-04-24, MRN [id number]  PCP:  Mayra Neer, MD  Cardiologist:  Sinclair Grooms, MD   Referring MD: Mayra Neer, MD   Chief Complaint  Patient presents with  . Coronary Artery Disease    RCA stenosis by CTA    History of Present Illness:    Danielle Gardner is a 57 y.o. female with a hx of  terrible metabolic state with uncontrolled diabetes, severe hyperlipidemia, partial anomalous coronary venous return to brachiocephalic vein,  uncontrolled blood pressure, and a several year history of intermittent paroxysmal nocturnal dyspnea.  Work-up of partial anomalous pulmonary venous return identified normal heart size and absence of ASD by transthoracic echo with bubble study.  No evidence of VSD or RV enlargement was noted however a coronary CTA was recommended for further evaluation.  My initial plan was not to move forward with a CTA but after the patient read the report, CTA requested that the coronary CTA be performed for full disclosure.  Study identified asymptomatic 80% ostial nondominant right coronary.  The patient is here now to discuss the findings of the coronary CT.  Clinical history with reference to potential symptoms related to coronary disease was obtained.  Patient is very active.  She works 3 jobs.  She does a lot of walking and is not experiencing dyspnea on exertion, chest pain, indigestion, or any cardiopulmonary complaints.  She rarely has episodes of feeling she may smother when she is sitting or lying down.  This is occurred off and on over several years.  Now that she knows that coronary disease is suggested by the coronary CTA she is not interested in having any invasive procedure or therapy if it can be avoided.  Past Medical History:  Diagnosis Date  . Diabetes mellitus     Past Surgical History:  Procedure Laterality Date  . CESAREAN SECTION     x 3  . TUBAL LIGATION   04-30-1997    Current Medications: Current Meds  Medication Sig  . aspirin 81 MG tablet Take 81 mg by mouth daily.  . Cholecalciferol (VITAMIN D) 2000 units tablet Take 1 tablet by mouth daily.  . insulin degludec (TRESIBA) 100 UNIT/ML SOPN FlexTouch Pen Inject 25 Units into the skin daily as needed. (Pending blood glucose)     Allergies:   Patient has no known allergies.   Social History   Socioeconomic History  . Marital status: Single    Spouse name: Not on file  . Number of children: Not on file  . Years of education: Not on file  . Highest education level: Not on file  Occupational History  . Occupation: Advertising account executive at [employer]  . Financial resource strain: Not on file  . Food insecurity:    Worry: Not on file    Inability: Not on file  . Transportation needs:    Medical: Not on file    Non-medical: Not on file  Tobacco Use  . Smoking status: Never Smoker  . Smokeless tobacco: Never Used  Substance and Sexual Activity  . Alcohol use: No  . Drug use: No  . Sexual activity: Not on file  Lifestyle  . Physical activity:    Days per week: Not on file    Minutes per session: Not on file  . Stress: Not on file  Relationships  . Social connections:    Talks on  phone: Not on file    Gets together: Not on file    Attends religious service: Not on file    Active member of club or organization: Not on file    Attends meetings of clubs or organizations: Not on file    Relationship status: Not on file  Other Topics Concern  . Not on file  Social History Narrative  . Not on file     Family History: The patient's family history includes Colon cancer in her brother; Diabetes in her brother, brother, father, maternal grandmother, paternal grandmother, and son; Heart disease in her father; Hyperlipidemia in her mother; Hypertension in her father and mother; Lupus in her sister; Prostate cancer in her father; Vision loss in her father.  ROS:     Please see the history of present illness.    No complaints.  Occasional fatigue she feels because of overworked and stress.  All other systems reviewed and are negative.  EKGs/Labs/Other Studies Reviewed:    The following studies were reviewed today:  2D Doppler echocardiogram with bubble study November 05, 2017: ------------------------------------------------------------------- Study Conclusions  - Left ventricle: The cavity size was normal. Systolic function was   normal. The estimated ejection fraction was in the range of 55%   to 60%. Wall motion was normal; there were no regional wall   motion abnormalities. Left ventricular diastolic function   parameters were normal. - Aortic valve: Trileaflet; mildly thickened, mildly calcified   leaflets. - Pulmonary arteries: Systolic pressure could not be accurately   estimated.  Impressions:  - Poor quality microcavitation contrast study. Suggestive of   possible PFO as a few bubbles are seen early in the LV and   possibly LA. Recommend Cardiac CTA for further evaluation.   Patient has a history of partial anomalous pulmonary venous   return.  Coronary CTA with FFR 03/08/2018: IMPRESSION: 1. Coronary calcium score of 2770. This was 46 percentile for age and sex matched control.  2. Normal coronary origin with right dominance.  3. Severe ostial RCA plaque with possible severe stenosis. Additional analysis with CT FFR will be submitted.  4.  No ASD was identified.  Right sided chambers have normal size.  5.  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  FFR analysis:  1. Left Main:  No significant stenosis.  2. LAD: No significant stenosis. 3. LCX: No significant stenosis. 4. RCA: Proximal RCA: 0.76, mid and distal: 0.67.  IMPRESSION: 1. CT FFR analysis showed severe stenosis in the ostial RCA. A cardiac catheterization is recommended.   EKG:  EKG is  ordered today.  The ekg ordered 10/26/2017  demonstrated normal sinus rhythm without evidence of RV conduction delay or bundle branch block.  Tracing was basically normal.  Recent Labs: No results found for requested labs within last 8760 hours.  Recent Lipid Panel No results found for: CHOL, TRIG, HDL, CHOLHDL, VLDL, LDLCALC, LDLDIRECT  Physical Exam:    VS:  BP 138/82   Pulse (!) 55   Ht 5\' 4"  (1.626 m)   Wt 168 lb (76.2 kg)   LMP 09/15/2012   SpO2 97%   BMI 28.84 kg/m     Wt Readings from Last 3 Encounters:  03/13/18 168 lb (76.2 kg)  10/26/17 169 lb (76.7 kg)  08/23/10 188 lb 6.4 oz (85.5 kg)     GEN:  Well nourished, well developed in no acute distress HEENT: Normal NECK: No JVD. LYMPHATICS: No lymphadenopathy CARDIAC: RRR, no murmur, no gallop, no  edema. VASCULAR: 2+ radial and posterior tibial bilateral pulses.  No bruits. RESPIRATORY:  Clear to auscultation without rales, wheezing or rhonchi  ABDOMEN: Soft, non-tender, non-distended, No pulsatile mass, MUSCULOSKELETAL: No deformity  SKIN: Warm and dry NEUROLOGIC:  Alert and oriented x 3 PSYCHIATRIC:  Normal affect   ASSESSMENT:    1. Coronary artery disease involving native coronary artery of native heart with other form of angina pectoris ([place])   2. Essential hypertension   3. Familial hypercholesterolemia   4. Family history of early CAD   44. Controlled type 2 diabetes mellitus with other circulatory complication, without long-term current use of insulin ([facility])   6. Partial anomalous pulmonary venous return    PLAN:    In order of problems listed above:  1. Long discussion with the patient concerning findings on CT coronary with FFR.  She declines the opportunity to have catheter based angiography performed.  She does acknowledge that there is likely high-grade obstruction in the right coronary.  In the absence of symptoms, our main focus is on aggressive risk prevention that was described in detail.  I will try to obtain data from her primary  physician, Dr. Mayra Neer.  We generally discussed the targets for risk reduction as noted below. 2. Blood pressure metric less than 130/80 mmHg. 3. Target LDL cholesterol less than 70.  Statin therapy and if unable to tolerate consider PCSK9. 4. Not addressed 5. Hemoglobin A1c less than 7.  Consider "flozin" therapy as discussed below. 6. Not associated with ASD.  No evidence of right heart volume overload.  No specific management changes required.  Overall education and awareness concerning primary/secondary risk prevention was discussed in detail: LDL less than 70, hemoglobin A1c less than 7, blood pressure target less than 130/80 mmHg, >150 minutes of moderate aerobic activity per week, avoidance of smoking, weight control (via diet and exercise), and continued surveillance/management of/for obstructive sleep apnea.  In addition to targeting a hemoglobin A1c less than 7 by conventional means, additional therapies to decrease CV risk should be considered.  SGLT2 inhibitor therapy ("-flozin" e.g. Invokana) decreases risk of heart failure development.  GLP-1 agonist therapy ("-glutide" agents e.g. Trulicity; or "-atide" agents e.g.Byetta) to reduce vascular events.  Greater than 50% of the time during this office visit was spent in education, counseling, and coordination of care related to underlying disease process and testing as outlined.   1 year clinical follow-up to reassess risk prevention efforts.  Obtain laboratory data from Dr. Brigitte Pulse.  Medication Adjustments/Labs and Tests Ordered: Current medicines are reviewed at length with the patient today.  Concerns regarding medicines are outlined above.  No orders of the defined types were placed in this encounter.  No orders of the defined types were placed in this encounter.   Patient Instructions  Medication Instructions:  No change If you need a refill on your cardiac medications before your next appointment, please call your  pharmacy.   Lab work: none If you have labs (blood work) drawn today and your tests are completely normal, you will receive your results only by: Marland Kitchen MyChart Message (if you have MyChart) OR . A paper copy in the mail If you have any lab test that is abnormal or we need to change your treatment, we will call you to review the results.  Testing/Procedures: none  Follow-Up: At [facility], you and your health needs are our priority.  As part of our continuing mission to provide you with exceptional heart care, we have  created designated Provider Care Teams.  These Care Teams include your primary Cardiologist (physician) and Advanced Practice Providers (APPs -  Physician Assistants and Nurse Practitioners) who all work together to provide you with the care you need, when you need it. You will need a follow up appointment in 12 months.  Please call our office 2 months in advance to schedule this appointment.  You may see Sinclair Grooms, MD or one of the following Advanced Practice Providers on your designated Care Team:   Truitt Merle, NP Cecilie Kicks, NP . Kathyrn Drown, NP  Any Other Special Instructions Will Be Listed Below (If Applicable).  Goals/Target:  BP < 130/80 LDL < 70 HgA1c <7 150 minutes aerobic exercise weekly   Addendum: pt requested and received form from medical records so that she can have her records/labs from Dr. Mayra Neer sent to Dr. Tamala Julian.    Signed, Sinclair Grooms, MD  03/13/2018 11:14 AM    [place]

## 2018-03-13 ENCOUNTER — Encounter: Payer: Self-pay | Admitting: Interventional Cardiology

## 2018-03-13 ENCOUNTER — Ambulatory Visit (INDEPENDENT_AMBULATORY_CARE_PROVIDER_SITE_OTHER): Payer: PRIVATE HEALTH INSURANCE | Admitting: Interventional Cardiology

## 2018-03-13 VITALS — BP 138/82 | HR 55 | Ht 64.0 in | Wt 168.0 lb

## 2018-03-13 DIAGNOSIS — E7801 Familial hypercholesterolemia: Secondary | ICD-10-CM

## 2018-03-13 DIAGNOSIS — Q263 Partial anomalous pulmonary venous connection: Secondary | ICD-10-CM

## 2018-03-13 DIAGNOSIS — Z8249 Family history of ischemic heart disease and other diseases of the circulatory system: Secondary | ICD-10-CM

## 2018-03-13 DIAGNOSIS — I25118 Atherosclerotic heart disease of native coronary artery with other forms of angina pectoris: Secondary | ICD-10-CM

## 2018-03-13 DIAGNOSIS — I10 Essential (primary) hypertension: Secondary | ICD-10-CM

## 2018-03-13 DIAGNOSIS — E1159 Type 2 diabetes mellitus with other circulatory complications: Secondary | ICD-10-CM

## 2018-03-13 NOTE — Patient Instructions (Addendum)
Medication Instructions:  No change If you need a refill on your cardiac medications before your next appointment, please call your pharmacy.   Lab work: none If you have labs (blood work) drawn today and your tests are completely normal, you will receive your results only by: Marland Kitchen MyChart Message (if you have MyChart) OR . A paper copy in the mail If you have any lab test that is abnormal or we need to change your treatment, we will call you to review the results.  Testing/Procedures: none  Follow-Up: At [facility], you and your health needs are our priority.  As part of our continuing mission to provide you with exceptional heart care, we have created designated Provider Care Teams.  These Care Teams include your primary Cardiologist (physician) and Advanced Practice Providers (APPs -  Physician Assistants and Nurse Practitioners) who all work together to provide you with the care you need, when you need it. You will need a follow up appointment in 12 months.  Please call our office 2 months in advance to schedule this appointment.  You may see Sinclair Grooms, MD or one of the following Advanced Practice Providers on your designated Care Team:   Truitt Merle, NP Cecilie Kicks, NP . Kathyrn Drown, NP  Any Other Special Instructions Will Be Listed Below (If Applicable).  Goals/Target:  BP < 130/80 LDL < 70 HgA1c <7 150 minutes aerobic exercise weekly   Addendum: pt requested and received form from medical records so that she can have her records/labs from Dr. Mayra Neer sent to Dr. Tamala Julian.

## 2018-08-12 ENCOUNTER — Other Ambulatory Visit: Payer: Self-pay | Admitting: Family Medicine

## 2018-08-12 DIAGNOSIS — Z1231 Encounter for screening mammogram for malignant neoplasm of breast: Secondary | ICD-10-CM

## 2019-08-14 IMAGING — MG DIGITAL SCREENING BILATERAL MAMMOGRAM WITH TOMO AND CAD
8 series · 8 of 24 positions shown · non-contrast
Comparison: Previous exam(s).

CLINICAL DATA: Screening.

EXAM:
DIGITAL SCREENING BILATERAL MAMMOGRAM WITH TOMO AND CAD

[L MLO synth-2D]
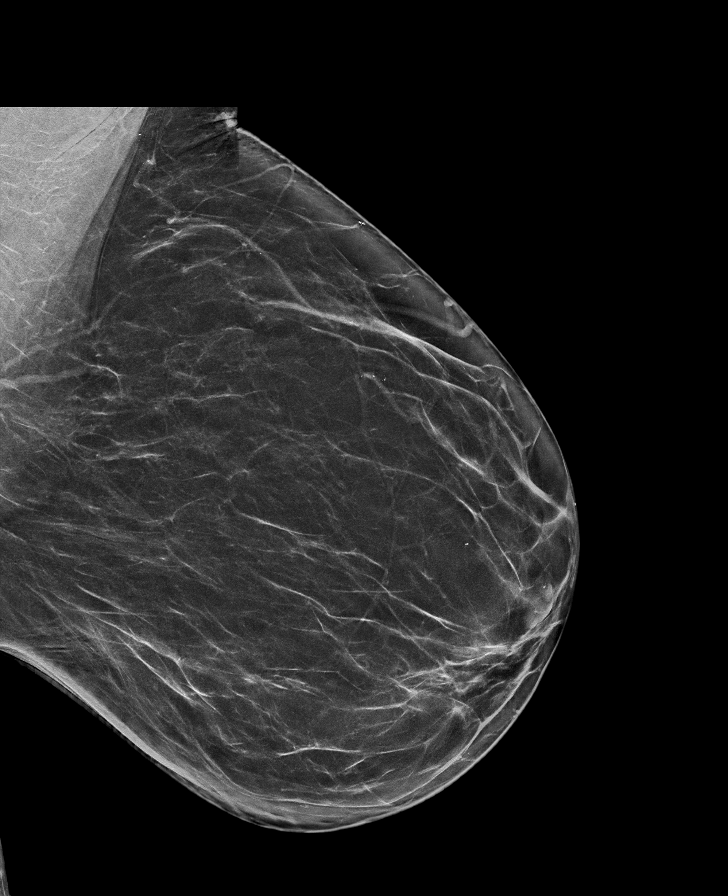

[R MLO synth-2D]
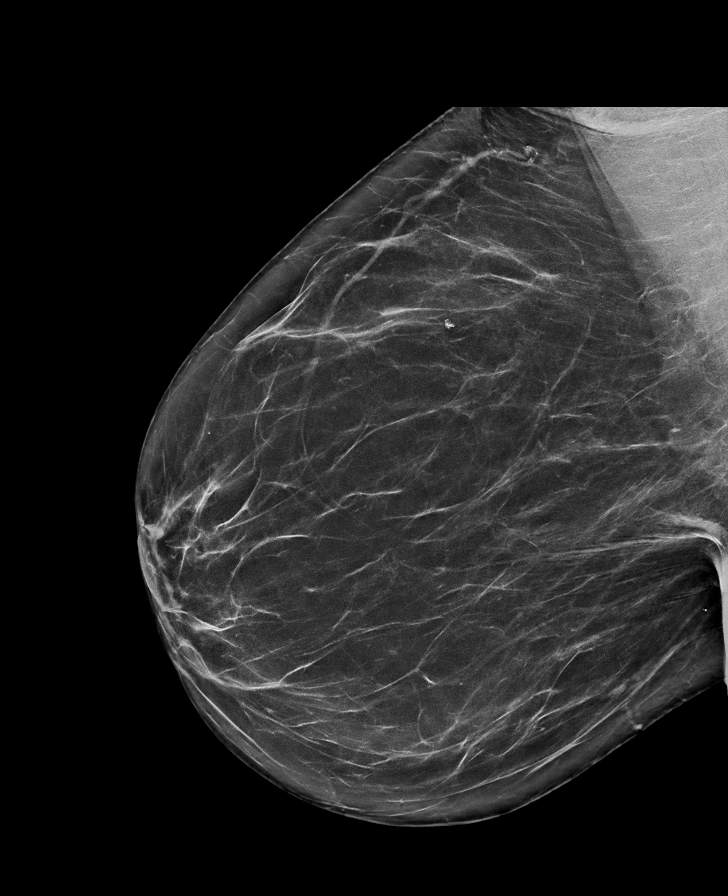

[L CC synth-2D]
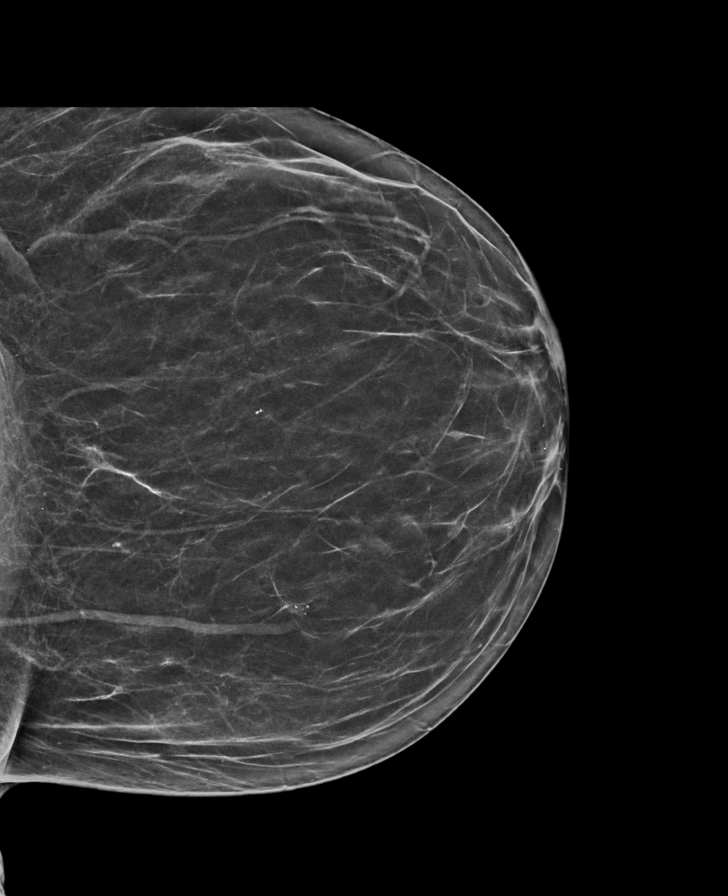

[R CC synth-2D]
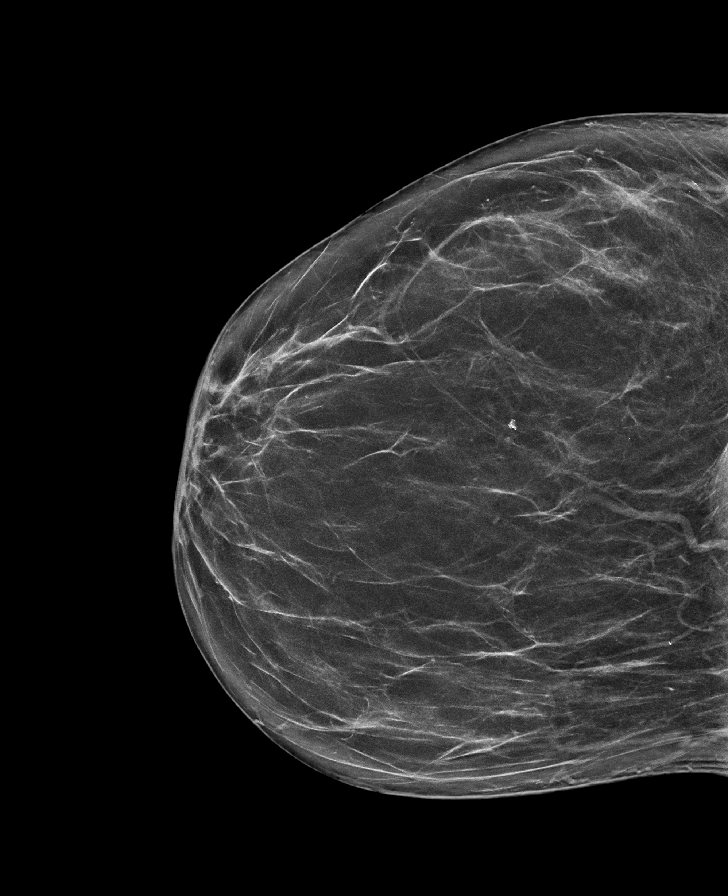

[R CC tomo · tomo slice 37/74.0]
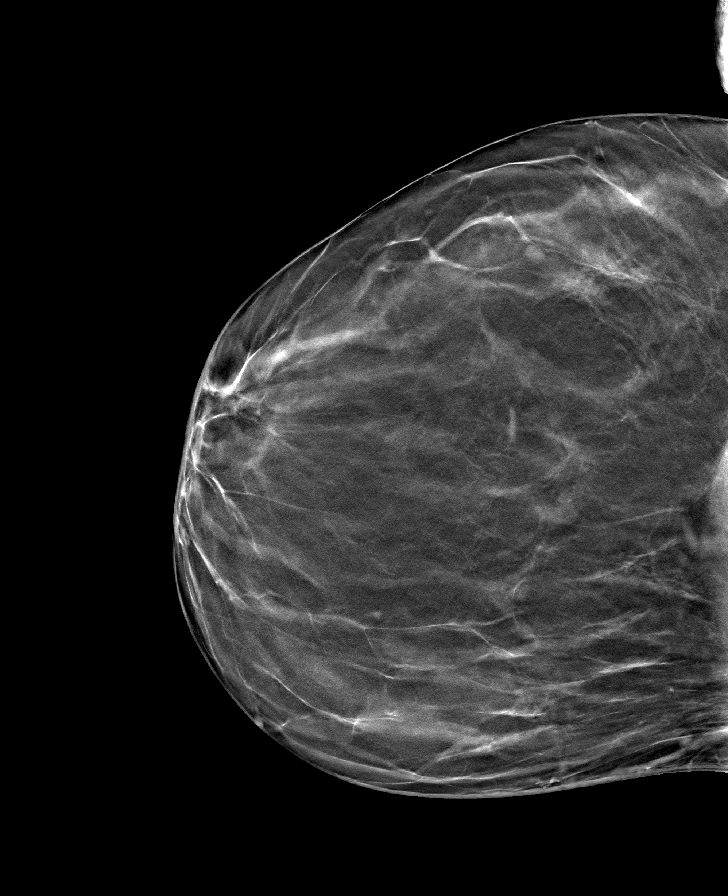

[L MLO tomo · tomo slice 41/81.0]
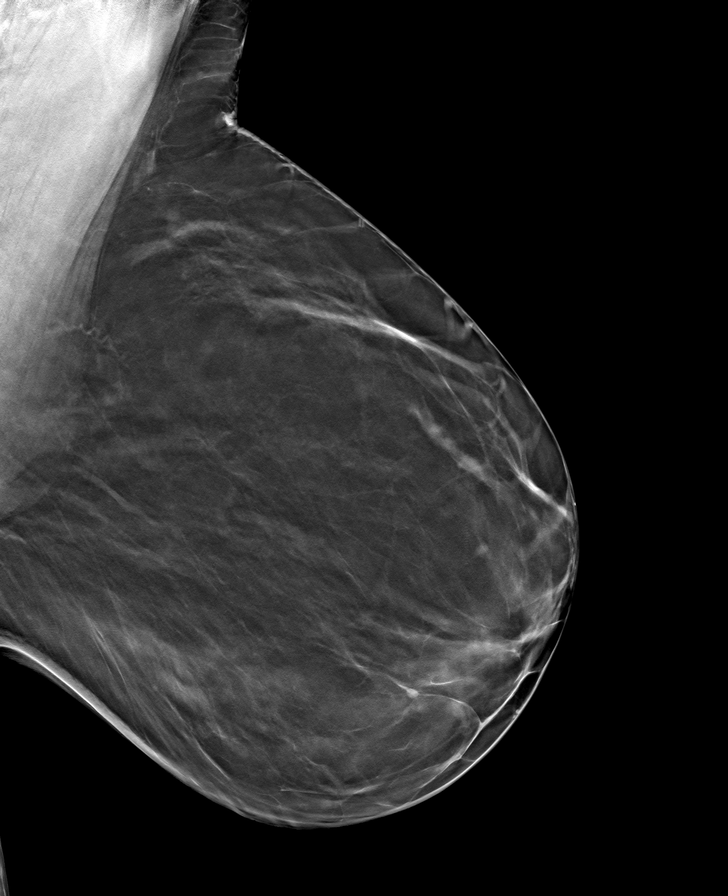

[L CC tomo · tomo slice 33/66.0]
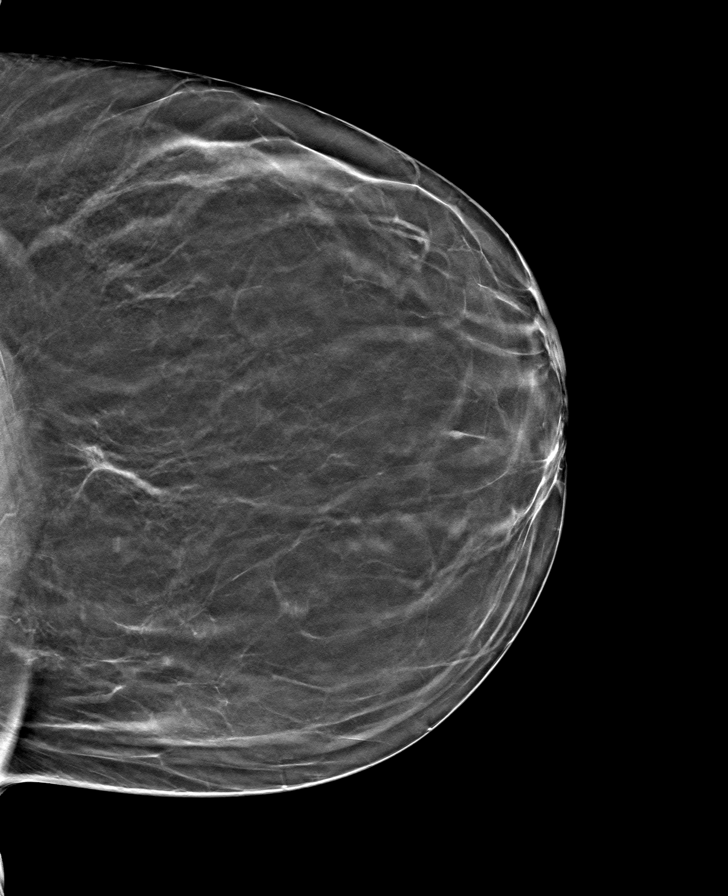

[R MLO tomo · tomo slice 41/81.0]
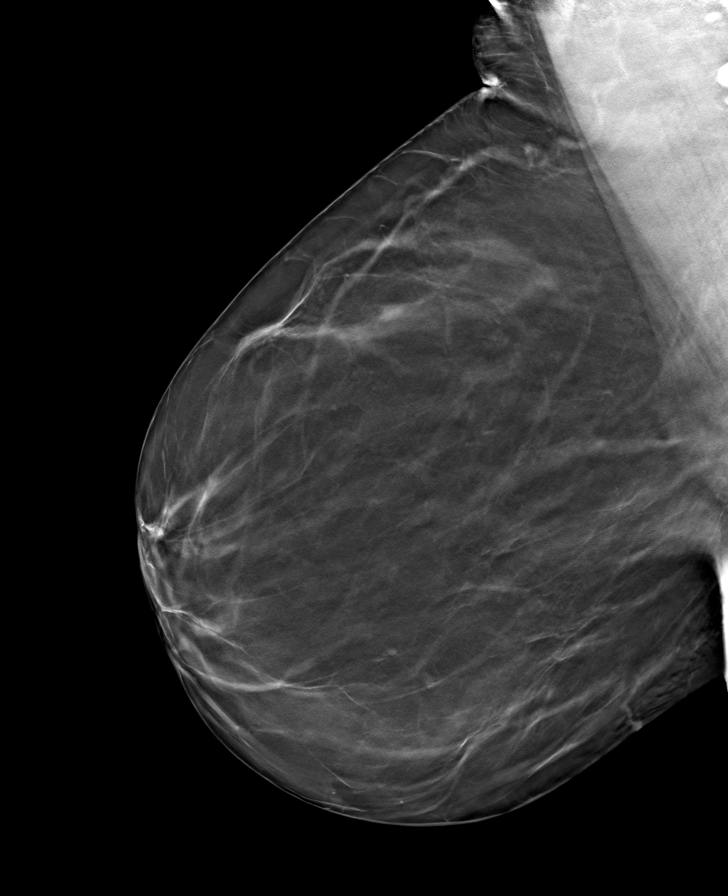

[8 of 24 positions shown; findings below may reference images not displayed]

ACR Breast Density Category b: There are scattered areas of
fibroglandular density.
FINDINGS: There are no findings suspicious for malignancy. Images were
processed with CAD.
IMPRESSION: No mammographic evidence of malignancy. A result letter of this
screening mammogram will be mailed directly to the patient.

RECOMMENDATION:
Screening mammogram in one year. (Code:[id number])

BI-RADS CATEGORY  1: Negative.
